# Patient Record
Sex: Male | Born: 1970 | Race: Asian | Hispanic: No | Marital: Married | Smoking: Current every day smoker
Health system: Southern US, Community
[De-identification: ages and names within clinical notes are randomized; demographics above are authoritative.]

---

## 2013-03-14 ENCOUNTER — Emergency Department (HOSPITAL_COMMUNITY)
Admission: EM | Admit: 2013-03-14 | Discharge: 2013-03-14 | Disposition: A | Discharge status: Home / Self Care | Payer: Worker's Compensation | Attending: Emergency Medicine | Admitting: Emergency Medicine

## 2013-03-14 ENCOUNTER — Encounter (HOSPITAL_COMMUNITY): Payer: Self-pay | Admitting: Emergency Medicine

## 2013-03-14 DIAGNOSIS — Y990 Civilian activity done for income or pay: Secondary | ICD-10-CM | POA: Insufficient documentation

## 2013-03-14 DIAGNOSIS — T1590XA Foreign body on external eye, part unspecified, unspecified eye, initial encounter: Secondary | ICD-10-CM | POA: Insufficient documentation

## 2013-03-14 DIAGNOSIS — T1592XA Foreign body on external eye, part unspecified, left eye, initial encounter: Secondary | ICD-10-CM

## 2013-03-14 DIAGNOSIS — F17200 Nicotine dependence, unspecified, uncomplicated: Secondary | ICD-10-CM | POA: Insufficient documentation

## 2013-03-14 DIAGNOSIS — IMO0002 Reserved for concepts with insufficient information to code with codable children: Secondary | ICD-10-CM | POA: Insufficient documentation

## 2013-03-14 DIAGNOSIS — Y929 Unspecified place or not applicable: Secondary | ICD-10-CM | POA: Insufficient documentation

## 2013-03-14 DIAGNOSIS — X088XXA Exposure to other specified smoke, fire and flames, initial encounter: Secondary | ICD-10-CM | POA: Insufficient documentation

## 2013-03-14 MED ORDER — FLUORESCEIN SODIUM 1 MG OP STRP
1.0000 | ORAL_STRIP | Freq: Once | OPHTHALMIC | Status: AC
  Administered 2013-03-14: 1 via OPHTHALMIC
  Filled 2013-03-14: qty 1

## 2013-03-14 MED ORDER — TETRACAINE HCL 0.5 % OP SOLN
2.0000 [drp] | Freq: Once | OPHTHALMIC | Status: AC
  Administered 2013-03-14: 2 [drp] via OPHTHALMIC
  Filled 2013-03-14: qty 2

## 2013-03-14 NOTE — ED Notes (Signed)
**Note Louis-Identified via Obfuscation** Patient is from home and accompanied by co-worker. Patient does not speak [place], co-worker will be available for translation. Patient reports welding a piece of metal went into his eye on Thursday. Patient was not wearing protective eye glasses at the time.   Patient states he has been putting eye drops from [facility] in his eyes, reports that his eye is worst. Patient c/o watery eyes and pain. Patient denies HA or dizziness. Patient states his eye is blurry.

## 2013-03-14 NOTE — ED Provider Notes (Signed)
**Note Blayden-Identified via Obfuscation** Medical screening examination/treatment/procedure(s) were performed by non-physician practitioner and as supervising physician I was immediately available for consultation/collaboration.  EKG Interpretation   None        Juliet Rude. Rubin Payor, MD 03/14/13 1536

## 2013-03-14 NOTE — ED Provider Notes (Signed)
**Note Matthan-Identified via Obfuscation** CSN: [id number]     Arrival date & time 03/14/13  1028 History   First MD Initiated Contact with Patient 03/14/13 1042     Chief Complaint  Patient presents with  . Foreign Body in Eye   (Consider location/radiation/quality/duration/timing/severity/associated sxs/prior Treatment) HPI Comments: Patient presents with a chief complaint of pain of his left eye.  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  He was not wearing protective glasses at that time.  He reports that he immediately flushed out his eyes with water.  He reports that he has had continued left eye pain, left eye redness, and tearing of the eye since that time.  He has been using OTC eyedrops since that time.  He reports blurred vision, but only when he has tearing of the eye.  Denies any discharge from the eye aside from the tearing.  He reports mild photophobia.    The history is provided by the patient. The history is limited by a language barrier. A language interpreter was used.    History reviewed. No pertinent past medical history. History reviewed. No pertinent past surgical history. No family history on file. History  Substance Use Topics  . Smoking status: Current Every Day Smoker -- 0.50 packs/day    Types: Cigarettes  . Smokeless tobacco: Not on file  . Alcohol Use: No    Review of Systems  Eyes: Positive for photophobia, pain and redness. Negative for discharge.  All other systems reviewed and are negative.    Allergies  Review of patient's allergies indicates no known allergies.  Home Medications   Current Outpatient Rx  Name  Route  Sig  Dispense  Refill  . Tetrahydrozoline HCl (EYE DROPS REGULAR OP)   Left Eye   Place 1 drop into the left eye daily as needed (irritation).          BP 142/80  Pulse 66  Temp(Src) 98.6 F (37 C) (Oral)  Resp 18  SpO2 99% Physical Exam  Nursing note and vitals  reviewed. Constitutional: He appears well-developed and well-nourished.  HENT:  Head: Normocephalic and atraumatic.  Mouth/Throat: Oropharynx is clear and moist.  Eyes: EOM and lids are normal. Pupils are equal, round, and reactive to light. Left eye exhibits no exudate. Left conjunctiva is injected.  Slit lamp exam:      The left eye shows foreign body.    Visual acuity right eye 20/20 Visual acuity left eye 20/40  Neck: Normal range of motion. Neck supple.  Cardiovascular: Normal rate, regular rhythm and normal heart sounds.   Pulmonary/Chest: Effort normal and breath sounds normal.  Neurological: He is alert.  Skin: Skin is warm and dry.  Psychiatric: He has a normal mood and affect.    ED Course  Procedures (including critical care time) Labs Review Labs Reviewed - No data to display Imaging Review No results found.  EKG Interpretation   None      12:20 PM Discussed with Dr. Luciana Axe with Ophthalmology.  He reports that the patient can follow up with him in the office today at 4:00 PM.  Patient informed of the plan and in agreement with the plan. MDM  No diagnosis found. Patient presents with a foreign body of his left eye that has been present since welding metal two days ago.  On exam, very small piece of metal visualized.  Attempted to flush the eye in the **Note Leiby-Identified via Obfuscation** ED without results.  Unable to remove the foreign body.  Dr. Luciana Axe with Ophthalmology consulted and has agreed to see the patient in the office today at 4:00 PM.  Patient informed of the plan and is in agreement with the plan.    Santiago Glad, PA-C 03/14/13 1241

## 2013-03-14 NOTE — ED Notes (Signed)
**Note Jamile-Identified via Obfuscation** Opened chart to see d/c instructions to explain questions to pt

## 2015-12-28 ENCOUNTER — Encounter (HOSPITAL_COMMUNITY): Payer: Self-pay

## 2015-12-28 ENCOUNTER — Emergency Department (HOSPITAL_COMMUNITY): Payer: Medicaid - Out of State

## 2015-12-28 ENCOUNTER — Emergency Department (HOSPITAL_COMMUNITY)
Admission: EM | Admit: 2015-12-28 | Discharge: 2015-12-28 | Disposition: A | Discharge status: Home / Self Care | Payer: Medicaid - Out of State | Attending: Emergency Medicine | Admitting: Emergency Medicine

## 2015-12-28 DIAGNOSIS — R002 Palpitations: Secondary | ICD-10-CM | POA: Diagnosis not present

## 2015-12-28 DIAGNOSIS — F17210 Nicotine dependence, cigarettes, uncomplicated: Secondary | ICD-10-CM | POA: Insufficient documentation

## 2015-12-28 DIAGNOSIS — R509 Fever, unspecified: Secondary | ICD-10-CM | POA: Diagnosis not present

## 2015-12-28 DIAGNOSIS — M549 Dorsalgia, unspecified: Secondary | ICD-10-CM | POA: Insufficient documentation

## 2015-12-28 DIAGNOSIS — R5383 Other fatigue: Secondary | ICD-10-CM | POA: Insufficient documentation

## 2015-12-28 DIAGNOSIS — R112 Nausea with vomiting, unspecified: Secondary | ICD-10-CM | POA: Diagnosis not present

## 2015-12-28 DIAGNOSIS — R42 Dizziness and giddiness: Secondary | ICD-10-CM | POA: Diagnosis not present

## 2015-12-28 DIAGNOSIS — R079 Chest pain, unspecified: Secondary | ICD-10-CM | POA: Diagnosis not present

## 2015-12-28 DIAGNOSIS — R1013 Epigastric pain: Secondary | ICD-10-CM | POA: Insufficient documentation

## 2015-12-28 DIAGNOSIS — R197 Diarrhea, unspecified: Secondary | ICD-10-CM | POA: Insufficient documentation

## 2015-12-28 LAB — URINALYSIS, ROUTINE W REFLEX MICROSCOPIC
Bilirubin Urine: NEGATIVE
GLUCOSE, UA: NEGATIVE mg/dL
HGB URINE DIPSTICK: NEGATIVE
Ketones, ur: 40 mg/dL — AB
Leukocytes, UA: NEGATIVE
Nitrite: NEGATIVE
PROTEIN: NEGATIVE mg/dL
Specific Gravity, Urine: 1.026 (ref 1.005–1.030)
pH: 6 (ref 5.0–8.0)

## 2015-12-28 LAB — CBC
HCT: 44.9 % (ref 39.0–52.0)
HEMOGLOBIN: 15.8 g/dL (ref 13.0–17.0)
MCH: 31.9 pg (ref 26.0–34.0)
MCHC: 35.2 g/dL (ref 30.0–36.0)
MCV: 90.5 fL (ref 78.0–100.0)
Platelets: 151 10*3/uL (ref 150–400)
RBC: 4.96 MIL/uL (ref 4.22–5.81)
RDW: 13.1 % (ref 11.5–15.5)
WBC: 11.2 10*3/uL — ABNORMAL HIGH (ref 4.0–10.5)

## 2015-12-28 LAB — I-STAT TROPONIN, ED: Troponin i, poc: 0.01 ng/mL (ref 0.00–0.08)

## 2015-12-28 LAB — COMPREHENSIVE METABOLIC PANEL
ALT: 33 U/L (ref 17–63)
AST: 22 U/L (ref 15–41)
Albumin: 3.8 g/dL (ref 3.5–5.0)
Alkaline Phosphatase: 44 U/L (ref 38–126)
Anion gap: 6 (ref 5–15)
BILIRUBIN TOTAL: 1 mg/dL (ref 0.3–1.2)
BUN: 26 mg/dL — ABNORMAL HIGH (ref 6–20)
CHLORIDE: 108 mmol/L (ref 101–111)
CO2: 25 mmol/L (ref 22–32)
CREATININE: 0.66 mg/dL (ref 0.61–1.24)
Calcium: 8.3 mg/dL — ABNORMAL LOW (ref 8.9–10.3)
Glucose, Bld: 156 mg/dL — ABNORMAL HIGH (ref 65–99)
Potassium: 3.3 mmol/L — ABNORMAL LOW (ref 3.5–5.1)
Sodium: 139 mmol/L (ref 135–145)
Total Protein: 6.4 g/dL — ABNORMAL LOW (ref 6.5–8.1)

## 2015-12-28 LAB — LIPASE, BLOOD: LIPASE: 22 U/L (ref 11–51)

## 2015-12-28 MED ORDER — SODIUM CHLORIDE 0.9 % IV BOLUS (SEPSIS)
1000.0000 mL | Freq: Once | INTRAVENOUS | Status: AC
  Administered 2015-12-28: 1000 mL via INTRAVENOUS

## 2015-12-28 MED ORDER — FENTANYL CITRATE (PF) 100 MCG/2ML IJ SOLN
50.0000 ug | Freq: Once | INTRAMUSCULAR | Status: AC
  Administered 2015-12-28: 50 ug via INTRAVENOUS
  Filled 2015-12-28: qty 2

## 2015-12-28 MED ORDER — ONDANSETRON HCL 4 MG/2ML IJ SOLN
4.0000 mg | Freq: Once | INTRAMUSCULAR | Status: AC
  Administered 2015-12-28: 4 mg via INTRAVENOUS
  Filled 2015-12-28: qty 2

## 2015-12-28 MED ORDER — PANTOPRAZOLE SODIUM 20 MG PO TBEC: 20.0000 mg | DELAYED_RELEASE_TABLET | Freq: Every day | ORAL | 0 refills | Status: AC

## 2015-12-28 MED ORDER — GI COCKTAIL ~~LOC~~
30.0000 mL | Freq: Once | ORAL | Status: AC
  Administered 2015-12-28: 30 mL via ORAL
  Filled 2015-12-28: qty 30

## 2015-12-28 MED ORDER — ONDANSETRON 4 MG PO TBDP: 4.0000 mg | ORAL_TABLET | Freq: Three times a day (TID) | ORAL | 0 refills | Status: AC | PRN

## 2015-12-28 MED ORDER — ACETAMINOPHEN 500 MG PO TABS
1000.0000 mg | ORAL_TABLET | Freq: Once | ORAL | Status: AC
  Administered 2015-12-28: 1000 mg via ORAL
  Filled 2015-12-28: qty 2

## 2015-12-28 NOTE — ED Notes (Signed)
**Note Lelon-Identified via Obfuscation** At end of triage, pt then c/o chest pain.  EKG to be performed.

## 2015-12-28 NOTE — ED Notes (Signed)
**Note Lewin-Identified via Obfuscation** Pt. Given water will check back to see how well tolerated.

## 2015-12-28 NOTE — ED Triage Notes (Signed)
**Note Tyriq-Identified via Obfuscation** Pt has not felt well for past 2 days.  Today has been vomiting with abdominal pain and back pain.  Headache with light headedness.

## 2015-12-28 NOTE — ED Provider Notes (Signed)
**Note Fernandez-Identified via Obfuscation** [facility]-EMERGENCY DEPT Provider Note   CSN: [id number] Arrival date & time: 12/28/15  1514     History   Chief Complaint Chief Complaint  Patient presents with  . Fever  . Emesis  . Chest Pain    HPI Larry Curry is a 45 y.o. male.  HPI   Patient is a 45 year old male who presents ER with complaint of N, V and epigastric abdominal pain that began today.  He reports mild epigastric abdominal pain the began just prior to one episode of vomiting this morning. Emesis is nonbloody and nonbilious, consistent undigested food.  After vomiting he had worsening abdominal pain and associated chest pain and high back pain all of which she describes as "food being stuck" with pain severity rated 8 out of 10 and gradually improving to currently being rated 4/10.  He was noted to have a fever when he presented to the ER.  He reports feeling generally unwell, with lightheadedness, generalized weakness, with sensation of racing heart, only symptoms began yesterday.  He reports loose stools today, no melena, no hematochezia.   Translator at the bedside who is a friend reports only PMHx of taking an allergy medication.   Patient denies any prior history of dysphasia of liquids or solids.    History reviewed. No pertinent past medical history.  There are no active problems to display for this patient.   History reviewed. No pertinent surgical history.     Home Medications    Prior to Admission medications   Medication Sig Start Date End Date Taking? Authorizing Provider  loratadine-pseudoephedrine (CLARITIN-D 24-HOUR) 10-240 MG 24 hr tablet Take 1 tablet by mouth daily as needed for allergies.   Yes Historical Provider, MD    Family History History reviewed. No pertinent family history.  Social History Social History  Substance Use Topics  . Smoking status: Current Every Day Smoker    Packs/day: 0.50    Types: Cigarettes  . Smokeless tobacco: Never Used  . Alcohol use No     Allergies    Review of patient's allergies indicates no known allergies.   Review of Systems Review of Systems  Constitutional: Positive for fatigue and fever. Negative for diaphoresis.  HENT: Negative.  Negative for congestion, postnasal drip and sore throat.   Eyes: Negative.   Respiratory: Negative for cough and wheezing.   Cardiovascular: Positive for chest pain and palpitations. Negative for leg swelling.  Gastrointestinal: Positive for abdominal pain, diarrhea, nausea and vomiting. Negative for blood in stool, constipation and rectal pain.  Endocrine: Negative.   Genitourinary: Negative.   Musculoskeletal: Positive for back pain and myalgias. Negative for arthralgias, gait problem, joint swelling and neck pain.  Skin: Negative.   Neurological: Positive for weakness and light-headedness. Negative for dizziness, syncope, facial asymmetry, speech difficulty, numbness and headaches.  Psychiatric/Behavioral: Negative.   All other systems reviewed and are negative.    Physical Exam Updated Vital Signs BP 123/75 (BP Location: Right Arm)   Pulse 84   Temp 100.2 F (37.9 C) (Oral)   Resp 23   SpO2 92%   Physical Exam  Constitutional: He is oriented to person, place, and time. He appears well-developed and well-nourished. No distress.  HENT:  Head: Normocephalic and atraumatic.  Nose: Nose normal.  Mouth/Throat: Oropharynx is clear and moist. No oropharyngeal exudate.  Eyes: Conjunctivae and EOM are normal. Pupils are equal, round, and reactive to light. Right eye exhibits no discharge. Left eye exhibits no discharge. No scleral icterus.  Neck: **Note [name]-Identified via Obfuscation** Normal range of motion. Neck supple. No JVD present. No tracheal deviation present. No thyromegaly present.  Cardiovascular: Normal rate, regular rhythm, normal heart sounds and intact distal pulses.  Exam reveals no gallop and no friction rub.   No murmur heard. Pulmonary/Chest: Effort normal. No stridor. No respiratory distress. He has no wheezes.  He has no rales. He exhibits no tenderness.  Mildly diminished BS bilaterally at the bases, no wheeze, rales or rhonchi  Abdominal: Soft. Bowel sounds are normal. He exhibits no distension and no mass. There is no tenderness. There is no rebound and no guarding.  Musculoskeletal: Normal range of motion. He exhibits no edema or tenderness.  Lymphadenopathy:    He has no cervical adenopathy.  Neurological: He is alert and oriented to person, place, and time. He exhibits normal muscle tone. Coordination normal.  Skin: Skin is warm and dry. Capillary refill takes less than 2 seconds. No rash noted. He is not diaphoretic. No erythema. No pallor.  Psychiatric: He has a normal mood and affect. His behavior is normal. Judgment and thought content normal.  Nursing note and vitals reviewed.    ED Treatments / Results  Labs (all labs ordered are listed, but only abnormal results are displayed) Labs Reviewed  CBC  COMPREHENSIVE METABOLIC PANEL  LIPASE, BLOOD  URINALYSIS, ROUTINE W REFLEX MICROSCOPIC (NOT AT [facility])  Rosezena Sensor, ED    EKG  EKG Interpretation  Date/Time:  Wednesday December 28 2015 15:59:47 EDT Ventricular Rate:  84 PR Interval:    QRS Duration: 101 QT Interval:  355 QTC Calculation: 420 R Axis:   78 Text Interpretation:  Sinus rhythm RSR' in V1 or V2, right VCD or RVH Minimal ST elevation, anterior leads No STEMI.  Similar to prior.  Confirmed by LONG MD, JOSHUA [id number]) on 12/28/2015 4:07:44 PM       Radiology No results found.  Procedures Procedures (including critical care time)  Medications Ordered in ED Medications  acetaminophen (TYLENOL) tablet 1,000 mg (not administered)  sodium chloride 0.9 % bolus 1,000 mL (not administered)  fentaNYL (SUBLIMAZE) injection 50 mcg (not administered)  ondansetron (ZOFRAN) injection 4 mg (4 mg Intravenous Given 12/28/15 1617)  sodium chloride 0.9 % bolus 1,000 mL (1,000 mLs Intravenous New Bag/Given 12/28/15 1617)      Initial Impression / Assessment and Plan / ED Course  I have reviewed the triage vital signs and the nursing notes.  Pertinent labs & imaging results that were available during my care of the patient were reviewed by me and considered in my medical decision making (see chart for details).  Clinical Course   Pt with vomiting and diarrhea, generalized malaise lightheadedness and weakness, history is somewhat limited by language barriers.  Pt complained of CP, which I suspect is secondary to his vomiting, but will add troponin, CXR and EKG.  Workup significant for mild hypokalemia, mild leukocytosis.  Troponin and CXR negative.  EKG sinus rhythm, non-ischemic. PT received 2L IVF, zofran and tylenol.  GI cocktail improved pain in chest and epigastrum.  Pt able to tolerate PO's.  Suspect gastroenteritis.  Pt appears stable to discharge home with zofran.  Encouraged supportive care with tylenol/ibuprofen, clear fluids.  Final Clinical Impressions(s) / ED Diagnoses   Final diagnoses:  Nausea vomiting and diarrhea  Epigastric pain    New Prescriptions Discharge Medication List as of 12/28/2015  7:58 PM    START taking these medications   Details  ondansetron (ZOFRAN ODT) 4 MG disintegrating tablet Take 1 tablet ( **Note Foch-Identified via Obfuscation** 4 mg total) by mouth every 8 (eight) hours as needed for nausea or vomiting., Starting Wed 12/28/2015, Print    pantoprazole (PROTONIX) 20 MG tablet Take 1 tablet (20 mg total) by mouth daily., Starting Wed 12/28/2015, Print         Danelle Berry, PA-C 01/11/16 1950    Vanetta Mulders, MD 01/12/16 1540

## 2015-12-28 NOTE — ED Notes (Signed)
**Note Ildefonso-Identified via Obfuscation** PO challenge successful. Pt able to tolerate crackers and water without difficulty, nausea, or vomiting.

## 2017-08-15 IMAGING — CR DG CHEST 2V
2 series · 2 of 2 positions shown · non-contrast
Comparison: None.

CLINICAL DATA: Vomiting and abdominal pain with chest and back
pain. Headache.

EXAM:
CHEST  2 VIEW

[w chest lat]
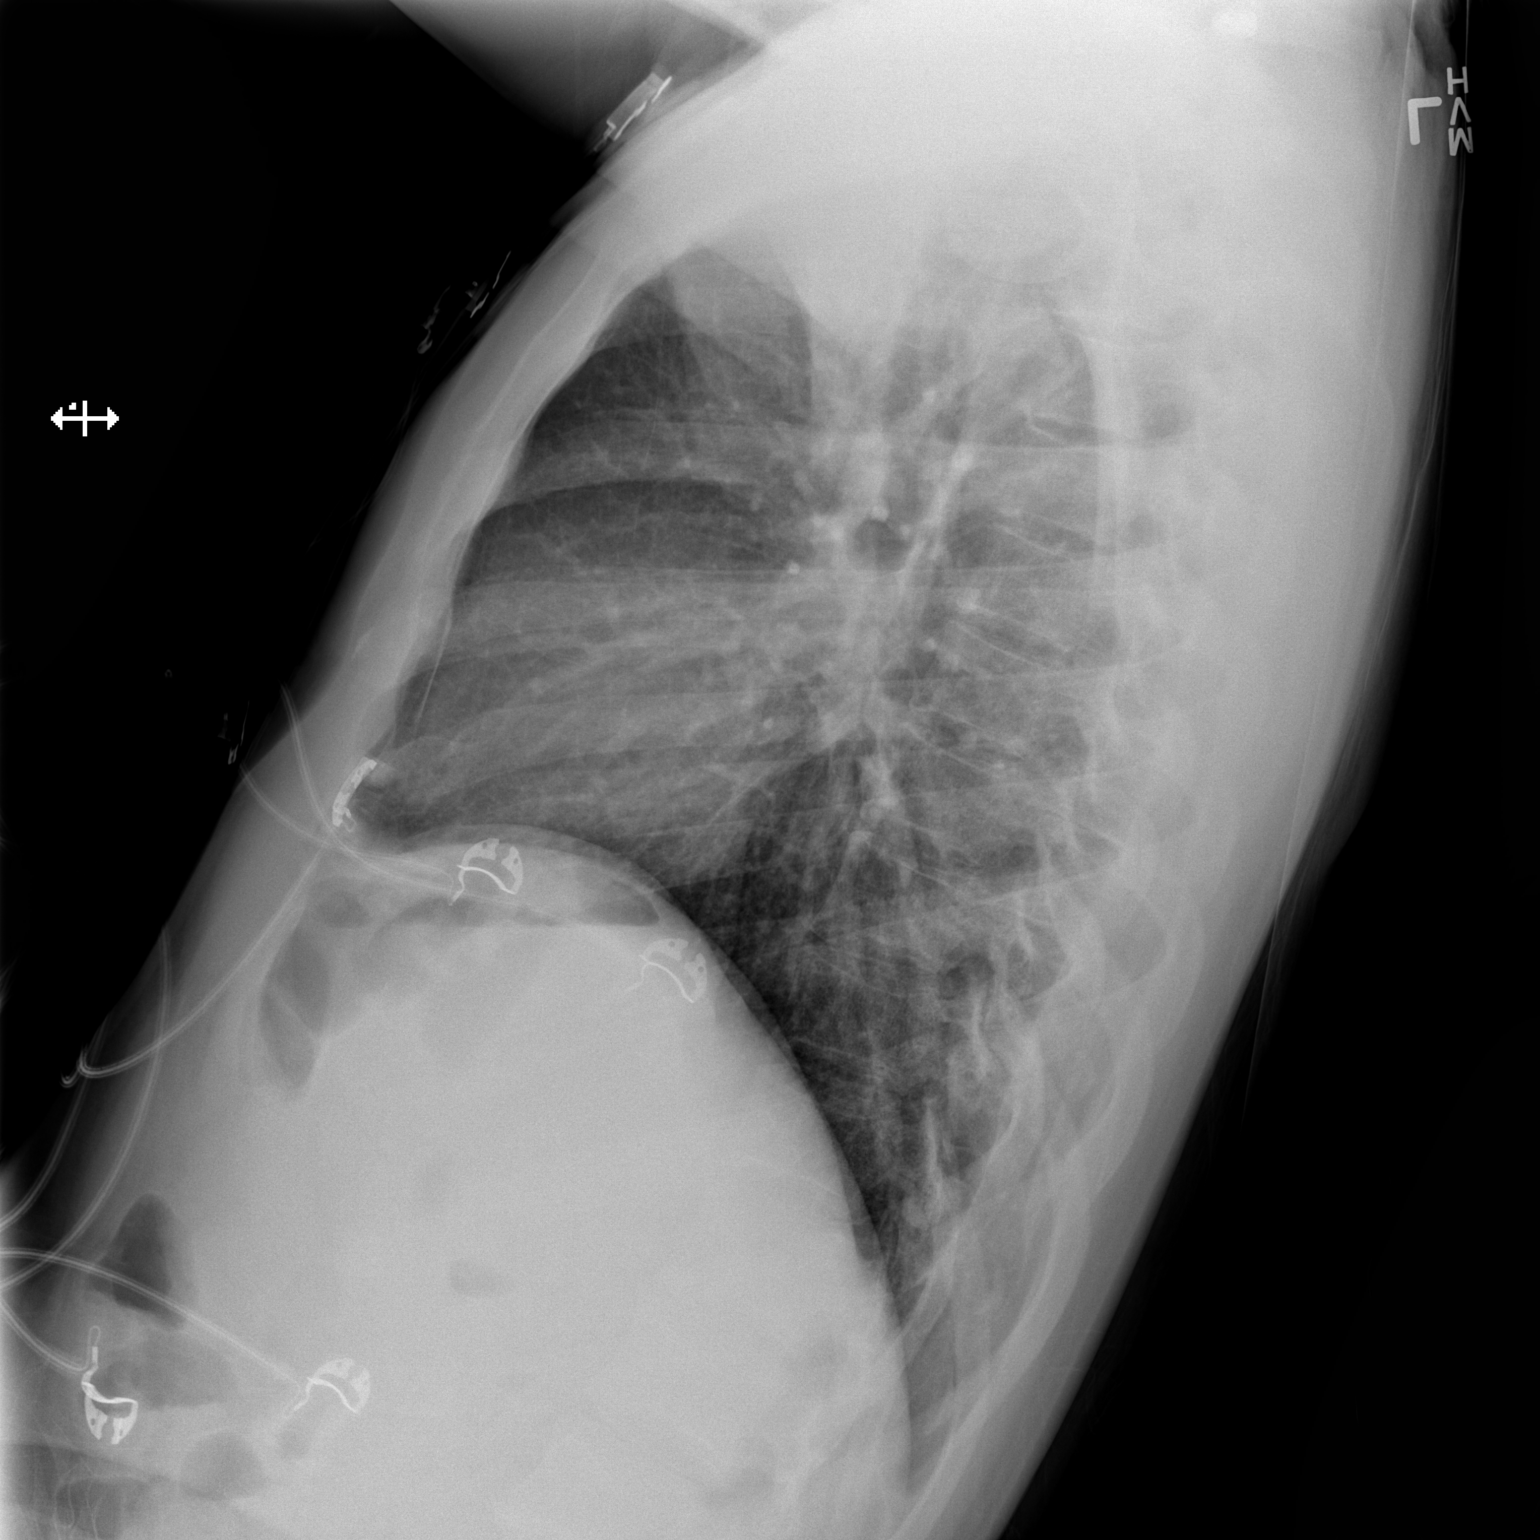

[x chest ap]
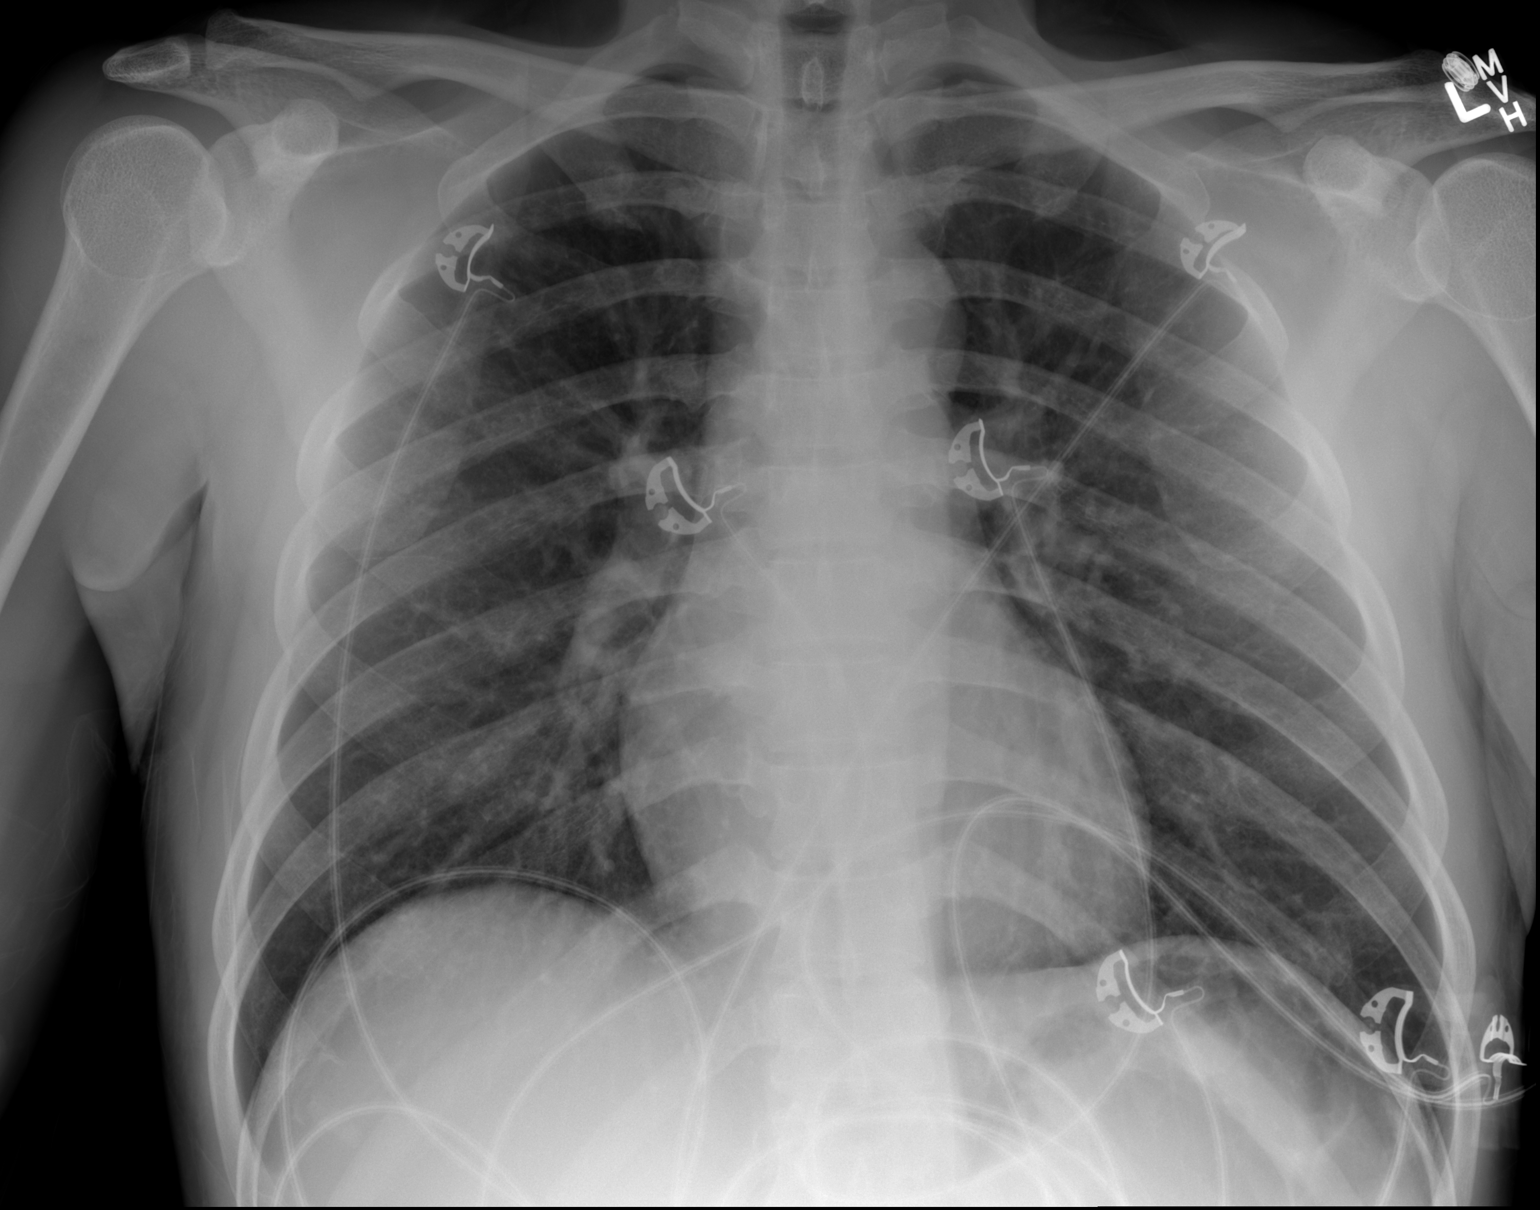

[2 of 2 positions shown; findings below may reference images not displayed]

FINDINGS: The heart size and mediastinal contours are within normal limits.
Both lungs are clear. The visualized skeletal structures are
unremarkable.
IMPRESSION: No active cardiopulmonary disease.
